# Patient Record
Sex: Female | Born: 1995 | Race: Black or African American | Hispanic: No | Marital: Single | Smoking: Never smoker
Health system: Southern US, Community
[De-identification: ages and names within clinical notes are randomized; demographics above are authoritative.]

## PROBLEM LIST (undated history)

## (undated) DIAGNOSIS — J45909 Unspecified asthma, uncomplicated: Secondary | ICD-10-CM

## (undated) DIAGNOSIS — R569 Unspecified convulsions: Secondary | ICD-10-CM

---
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
## 2016-01-29 LAB — HM PAP SMEAR

## 2016-02-02 ENCOUNTER — Other Ambulatory Visit: Payer: Self-pay | Admitting: Obstetrics & Gynecology

## 2016-02-02 DIAGNOSIS — N6002 Solitary cyst of left breast: Secondary | ICD-10-CM

## 2016-02-09 ENCOUNTER — Ambulatory Visit
Admission: RE | Admit: 2016-02-09 | Discharge: 2016-02-09 | Disposition: A | Discharge status: Home / Self Care | Payer: BC Managed Care – PPO | Source: Ambulatory Visit | Attending: Obstetrics & Gynecology | Admitting: Obstetrics & Gynecology

## 2016-02-09 DIAGNOSIS — N6002 Solitary cyst of left breast: Secondary | ICD-10-CM

## 2016-03-25 ENCOUNTER — Encounter: Payer: Self-pay | Admitting: Family Medicine

## 2016-03-25 ENCOUNTER — Ambulatory Visit (INDEPENDENT_AMBULATORY_CARE_PROVIDER_SITE_OTHER): Payer: BC Managed Care – PPO | Admitting: Family Medicine

## 2016-03-25 DIAGNOSIS — Z0000 Encounter for general adult medical examination without abnormal findings: Secondary | ICD-10-CM | POA: Diagnosis not present

## 2016-03-25 LAB — CBC WITH DIFFERENTIAL/PLATELET
BASOS ABS: 0 {cells}/uL (ref 0–200)
Basophils Relative: 0 %
Eosinophils Absolute: 48 cells/uL (ref 15–500)
Eosinophils Relative: 1 %
HEMATOCRIT: 39.6 % (ref 35.0–45.0)
Hemoglobin: 13.4 g/dL (ref 11.7–15.5)
LYMPHS PCT: 22 %
Lymphs Abs: 1056 cells/uL (ref 850–3900)
MCH: 30.4 pg (ref 27.0–33.0)
MCHC: 33.8 g/dL (ref 32.0–36.0)
MCV: 89.8 fL (ref 80.0–100.0)
MPV: 10.8 fL (ref 7.5–12.5)
Monocytes Absolute: 336 cells/uL (ref 200–950)
Monocytes Relative: 7 %
Neutro Abs: 3360 cells/uL (ref 1500–7800)
Neutrophils Relative %: 70 %
Platelets: 253 10*3/uL (ref 140–400)
RBC: 4.41 MIL/uL (ref 3.80–5.10)
RDW: 12.6 % (ref 11.0–15.0)
WBC: 4.8 10*3/uL (ref 3.8–10.8)

## 2016-03-25 LAB — LIPID PANEL
Cholesterol: 155 mg/dL (ref 125–200)
HDL: 76 mg/dL (ref >=46)
LDL CALC: 70 mg/dL (ref <130)
Total CHOL/HDL Ratio: 2 Ratio (ref <=5.0)
Triglycerides: 43 mg/dL (ref <150)
VLDL: 9 mg/dL (ref <30)

## 2016-03-25 LAB — HEPATIC FUNCTION PANEL
ALK PHOS: 38 U/L (ref 33–115)
ALT: 9 U/L (ref 6–29)
AST: 14 U/L (ref 10–30)
Albumin: 4.6 g/dL (ref 3.6–5.1)
BILIRUBIN DIRECT: 0.3 mg/dL — AB (ref <=0.2)
Indirect Bilirubin: 1 mg/dL (ref 0.2–1.2)
TOTAL PROTEIN: 6.7 g/dL (ref 6.1–8.1)
Total Bilirubin: 1.3 mg/dL — ABNORMAL HIGH (ref 0.2–1.2)

## 2016-03-25 LAB — TSH: TSH: 1.21 mIU/L

## 2016-03-25 LAB — BASIC METABOLIC PANEL
BUN: 14 mg/dL (ref 7–25)
CALCIUM: 9.2 mg/dL (ref 8.6–10.2)
CHLORIDE: 106 mmol/L (ref 98–110)
CO2: 24 mmol/L (ref 20–31)
Creat: 1.06 mg/dL (ref 0.50–1.10)
GLUCOSE: 81 mg/dL (ref 65–99)
POTASSIUM: 4.1 mmol/L (ref 3.5–5.3)
SODIUM: 141 mmol/L (ref 135–146)

## 2016-03-25 NOTE — Progress Notes (Signed)
   Subjective:    Patient ID: Sandra Schroeder, female    DOB: 11/22/1995, 20 y.o.   MRN: [id number]  HPI New to establish.  Recently moved from [place].  UTD on GYN [facility]([place])  CPE- no concerns.  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   Review of Systems Patient reports no vision/ hearing changes, adenopathy,fever, weight change,  persistant/recurrent hoarseness , swallowing issues, chest pain, palpitations, edema, persistant/recurrent cough, hemoptysis, dyspnea (rest/exertional/paroxysmal nocturnal), gastrointestinal bleeding (melena, rectal bleeding), abdominal pain, significant heartburn, bowel changes, GU symptoms (dysuria, hematuria, incontinence), Gyn symptoms (abnormal  bleeding, pain),  syncope, focal weakness, memory loss, numbness & tingling, skin/hair/nail changes, abnormal bruising or bleeding, anxiety, or depression.     Objective:   Physical Exam General Appearance:    Alert, cooperative, no distress, appears stated age  Head:    Normocephalic, without obvious abnormality, atraumatic  Eyes:    PERRL, conjunctiva/corneas clear, EOM's intact, fundi    benign, both eyes  Ears:    Normal TM's and external ear canals, both ears  Nose:   Nares normal, septum midline, mucosa normal, no drainage    or sinus tenderness  Throat:   Lips, mucosa, and tongue normal; teeth and gums normal  Neck:   Supple, symmetrical, trachea midline, no adenopathy;    Thyroid: no enlargement/tenderness/nodules  Back:     Symmetric, no curvature, ROM normal, no CVA tenderness  Lungs:     Clear to auscultation bilaterally, respirations unlabored  Chest Wall:    No tenderness or deformity   Heart:    Regular rate and rhythm, S1 and S2 normal, no murmur, rub   or gallop  Breast Exam:    Deferred to GYN  Abdomen:     Soft, non-tender, bowel sounds active all four quadrants,    no masses, no organomegaly  Genitalia:    Deferred to GYN  Rectal:    Extremities:   Extremities normal, atraumatic, no  cyanosis or edema  Pulses:   2+ and symmetric all extremities  Skin:   Skin color, texture, turgor normal, no rashes or lesions  Lymph nodes:   Cervical, supraclavicular, and axillary nodes normal  Neurologic:   CNII-XII intact, normal strength, sensation and reflexes    throughout          Assessment & Plan:

## 2016-03-25 NOTE — Progress Notes (Signed)
Pre visit review using our clinic review tool, if applicable. No additional management support is needed unless otherwise documented below in the visit note. 

## 2016-03-25 NOTE — Patient Instructions (Signed)
Follow up in 1 year or as needed We'll notify you of your lab results and make any changes if needed Keep up the good work on healthy diet and regular exercise- you look great! Call with any questions or concerns Welcome!  We're glad to have you!!! 

## 2016-03-25 NOTE — Assessment & Plan Note (Signed)
Pt's PE WNL.  [place]- [place]  Asked pt to bring this at next visit or fax this over.  Check labs as baseline.  Pt declines flu shot today.  Anticipatory guidance provided.

## 2016-03-26 LAB — VITAMIN D 25 HYDROXY (VIT D DEFICIENCY, FRACTURES): Vit D, 25-Hydroxy: 29 ng/mL — ABNORMAL LOW (ref 30–100)

## 2016-03-28 ENCOUNTER — Encounter: Payer: Self-pay | Admitting: General Practice

## 2017-02-08 LAB — HM PAP SMEAR

## 2017-02-09 ENCOUNTER — Encounter: Payer: Self-pay | Admitting: Family Medicine

## 2017-03-27 ENCOUNTER — Ambulatory Visit (INDEPENDENT_AMBULATORY_CARE_PROVIDER_SITE_OTHER): Payer: BC Managed Care – PPO | Admitting: Family Medicine

## 2017-03-27 ENCOUNTER — Encounter: Payer: Self-pay | Admitting: Family Medicine

## 2017-03-27 VITALS — BP 108/68 | HR 90 | Temp 99.3°F | Resp 16 | Ht 67.0 in | Wt 122.5 lb

## 2017-03-27 DIAGNOSIS — Z0000 Encounter for general adult medical examination without abnormal findings: Secondary | ICD-10-CM

## 2017-03-27 DIAGNOSIS — Z23 Encounter for immunization: Secondary | ICD-10-CM

## 2017-03-27 NOTE — Assessment & Plan Note (Signed)
Pt's PE WNL.  UTD on GYN.  Tdap given today.  No need for labs.  Anticipatory guidance provided.

## 2017-03-27 NOTE — Patient Instructions (Signed)
Follow up in 1 year or as needed Make sure you are eating regularly!!!  Snacks will help prevent that in between dizzy feeling Call with any questions or concerns Keep up the good work!  You look great! Daisey Must With School!!!

## 2017-03-27 NOTE — Progress Notes (Signed)
   Subjective:    Patient ID: Sandra Schroeder, female    DOB: May 22, 1996, 21 y.o.   MRN: [id number]  HPI CPE- UTD on GYN.  Due for Tdap.  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   Review of Systems Patient reports no vision/ hearing changes, adenopathy,fever, weight change,  persistant/recurrent hoarseness , swallowing issues, chest pain, palpitations, edema, persistant/recurrent cough, hemoptysis, dyspnea (rest/exertional/paroxysmal nocturnal), gastrointestinal bleeding (melena, rectal bleeding), abdominal pain, significant heartburn, bowel changes, GU symptoms (dysuria, hematuria, incontinence), Gyn symptoms (abnormal  bleeding, pain),  syncope, focal weakness, memory loss, numbness & tingling, skin/hair/nail changes, abnormal bruising or bleeding, anxiety, or depression.     Objective:   Physical Exam General Appearance:    Alert, cooperative, no distress, appears stated age  Head:    Normocephalic, without obvious abnormality, atraumatic  Eyes:    PERRL, conjunctiva/corneas clear, EOM's intact, fundi    benign, both eyes  Ears:    Normal TM's and external ear canals, both ears  Nose:   Nares normal, septum midline, mucosa normal, no drainage    or sinus tenderness  Throat:   Lips, mucosa, and tongue normal; teeth and gums normal  Neck:   Supple, symmetrical, trachea midline, no adenopathy;    Thyroid: no enlargement/tenderness/nodules  Back:     Symmetric, no curvature, ROM normal, no CVA tenderness  Lungs:     Clear to auscultation bilaterally, respirations unlabored  Chest Wall:    No tenderness or deformity   Heart:    Regular rate and rhythm, S1 and S2 normal, no murmur, rub   or gallop  Breast Exam:    Deferred to GYN  Abdomen:     Soft, non-tender, bowel sounds active all four quadrants,    no masses, no organomegaly  Genitalia:    Deferred to GYN  Rectal:    Extremities:   Extremities normal, atraumatic, no cyanosis or edema  Pulses:   2+ and  symmetric all extremities  Skin:   Skin color, texture, turgor normal, no rashes or lesions  Lymph nodes:   Cervical, supraclavicular, and axillary nodes normal  Neurologic:   CNII-XII intact, normal strength, sensation and reflexes    throughout          Assessment & Plan:

## 2017-03-27 NOTE — Addendum Note (Signed)
Addended by: Geannie Risen on: 03/27/2017 04:17 PM   Modules accepted: Orders

## 2017-06-29 IMAGING — US ULTRASOUND LEFT BREAST LIMITED
1 series · 1 of 1 positions shown · non-contrast
Comparison: Previous exam(s).

CLINICAL DATA: Lump felt in the patient's left breast by the
patient's physician.

EXAM:
ULTRASOUND OF THE LEFT BREAST

[Series 1: ultrasound left breast limited · 0.07mm/px · 1 of 1 slices shown]
[im 1/1]
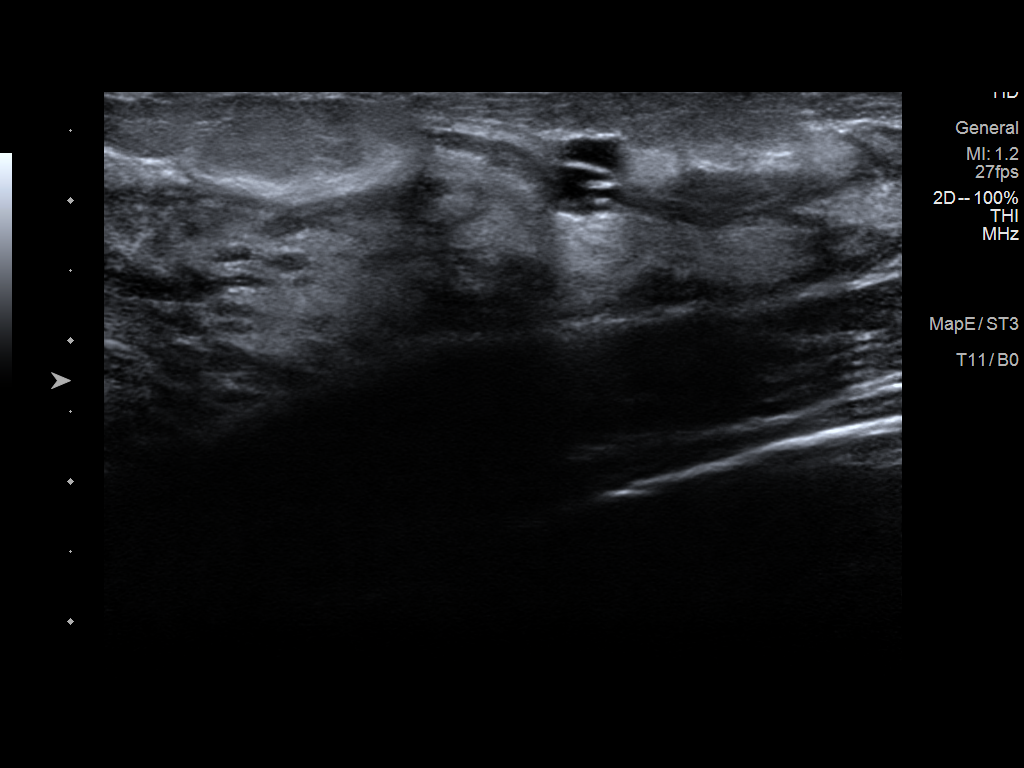

[1 of 1 positions shown; findings below may reference images not displayed]

FINDINGS: On physical exam, no suspicious lumps are identified.

Targeted ultrasound is performed, showing fibrocystic changes with
no suspicious masses.
IMPRESSION: No sonographic evidence of malignancy.

RECOMMENDATION:
Treatment of the patient's lump should be based on clinical and
physical exam given lack of imaging findings. Recommend annual
screening mammography beginning at the age of 40.

I have discussed the findings and recommendations with the patient.
Results were also provided in writing at the conclusion of the
visit. If applicable, a reminder letter will be sent to the patient
regarding the next appointment.

BI-RADS CATEGORY  2: Benign Finding(s)

## 2017-08-07 ENCOUNTER — Encounter: Payer: Self-pay | Admitting: General Practice

## 2018-03-29 ENCOUNTER — Encounter: Payer: BC Managed Care – PPO | Admitting: Family Medicine
